# Patient Record
Sex: Male | Born: 1965
Health system: Southern US, Community
[De-identification: ages and names within clinical notes are randomized; demographics above are authoritative.]

## PROBLEM LIST (undated history)

## (undated) DIAGNOSIS — G8929 Other chronic pain: Secondary | ICD-10-CM

## (undated) DIAGNOSIS — K219 Gastro-esophageal reflux disease without esophagitis: Secondary | ICD-10-CM

## (undated) DIAGNOSIS — F419 Anxiety disorder, unspecified: Secondary | ICD-10-CM

## (undated) DIAGNOSIS — M1990 Unspecified osteoarthritis, unspecified site: Secondary | ICD-10-CM

## (undated) HISTORY — PX: ROTATOR CUFF REPAIR: SHX139

## (undated) HISTORY — PX: COLONOSCOPY: SHX174

## (undated) HISTORY — DX: Unspecified osteoarthritis, unspecified site: M19.90

## (undated) HISTORY — DX: Gastro-esophageal reflux disease without esophagitis: K21.9

## (undated) HISTORY — PX: KNEE SURGERY: SHX244

## (undated) HISTORY — DX: Other chronic pain: G89.29

## (undated) HISTORY — PX: OTHER SURGICAL HISTORY: SHX169

## (undated) HISTORY — DX: Anxiety disorder, unspecified: F41.9

---

## 2013-11-01 ENCOUNTER — Other Ambulatory Visit (HOSPITAL_COMMUNITY): Payer: Self-pay | Admitting: Neurosurgery

## 2013-11-01 DIAGNOSIS — M5416 Radiculopathy, lumbar region: Secondary | ICD-10-CM

## 2013-11-03 ENCOUNTER — Ambulatory Visit (HOSPITAL_COMMUNITY)
Admission: RE | Admit: 2013-11-03 | Discharge: 2013-11-03 | Disposition: A | Discharge status: Home / Self Care | Payer: BC Managed Care – PPO | Source: Ambulatory Visit | Attending: Neurosurgery | Admitting: Neurosurgery

## 2013-11-03 ENCOUNTER — Ambulatory Visit (HOSPITAL_COMMUNITY): Admission: RE | Admit: 2013-11-03 | Payer: BC Managed Care – PPO | Source: Ambulatory Visit

## 2013-11-03 ENCOUNTER — Other Ambulatory Visit (HOSPITAL_COMMUNITY): Payer: Self-pay | Admitting: Neurosurgery

## 2013-11-03 DIAGNOSIS — M545 Low back pain, unspecified: Secondary | ICD-10-CM | POA: Insufficient documentation

## 2013-11-03 DIAGNOSIS — M5416 Radiculopathy, lumbar region: Secondary | ICD-10-CM

## 2013-11-03 DIAGNOSIS — M79609 Pain in unspecified limb: Secondary | ICD-10-CM | POA: Insufficient documentation

## 2013-11-03 DIAGNOSIS — M5126 Other intervertebral disc displacement, lumbar region: Secondary | ICD-10-CM | POA: Insufficient documentation

## 2013-11-03 MED ORDER — IOHEXOL 180 MG/ML  SOLN
20.0000 mL | Freq: Once | INTRAMUSCULAR | Status: AC | PRN
  Administered 2013-11-03: 20 mL via INTRATHECAL

## 2013-11-03 MED ORDER — HYDROCODONE-ACETAMINOPHEN 5-325 MG PO TABS: 1.0000 | ORAL_TABLET | ORAL | Status: DC | PRN

## 2013-11-03 MED ORDER — HYDROCODONE-ACETAMINOPHEN 5-325 MG PO TABS
1.0000 | ORAL_TABLET | Freq: Once | ORAL | Status: AC
Start: 2013-11-03 — End: 2013-11-03
  Administered 2013-11-03: 1 via ORAL

## 2013-11-03 MED ORDER — DIAZEPAM 5 MG PO TABS
ORAL_TABLET | ORAL | Status: AC
  Administered 2013-11-03: 5 mg via ORAL
  Filled 2013-11-03: qty 1

## 2013-11-03 MED ORDER — HYDROCODONE-ACETAMINOPHEN 5-325 MG PO TABS
ORAL_TABLET | ORAL | Status: AC
  Filled 2013-11-03: qty 1

## 2013-11-03 MED ORDER — ONDANSETRON HCL 4 MG/2ML IJ SOLN: 4.0000 mg | Freq: Four times a day (QID) | INTRAMUSCULAR | Status: DC | PRN

## 2013-11-03 MED ORDER — DIAZEPAM 5 MG PO TABS
10.0000 mg | ORAL_TABLET | Freq: Once | ORAL | Status: AC
  Administered 2013-11-03: 5 mg via ORAL

## 2013-11-03 NOTE — Discharge Instructions (Signed)
Myelography °Care After °These instructions give you information on caring for yourself after your procedure. Your doctor may also give you specific instructions. Call your doctor if you have any problems or questions after your procedure. °HOME CARE °· Rest often the first day. °· When you rest, lie flat, with your head slightly raised (elevated). °· Avoid heavy lifting and activity for 48 hours. °· You may take the bandage (dressing) off 1 day after the test. °GET HELP RIGHT AWAY IF:  °· You have a very bad headache. °· You have a fever. °MAKE SURE YOU: °· Understand these instructions. °· Will watch your condition. °· Will get help right away if you are not doing well or get worse. °Document Released: 04/22/2008 Document Revised: 06/30/2012 Document Reviewed: 04/07/2012 °ExitCare® Patient Information ©2014 [employer]. ° °

## 2013-11-03 NOTE — Op Note (Signed)
*   No surgery found * Lumbar Myelogram  PATIENT:  Chad Zhang is a 48 y.o. male  PRE-OPERATIVE DIAGNOSIS:  Left lower extremity pain  POST-OPERATIVE DIAGNOSIS:  same  PROCEDURE:  Lumbar Myelogram  SURGEON:  Delainie Chavana  ANESTHESIA:   local LOCAL MEDICATIONS USED:  LIDOCAINE  and Amount: 7 ml Procedure Note: [name] had his back prepared and draped in a sterile manner. I infiltrated 7cc lidocaine lateral to the midline at the L45 level. I entered the thecal sac at the L3/4 innerspace and infiltrated 20cc 180 omnipaque. Fluoroscopy showed the contrast in the thecal sac. [name] tolerated the procedure well. I removed the needle easily.    PATIENT DISPOSITION:  PACU - hemodynamically stable.

## 2013-11-08 NOTE — H&P (Signed)
BP 108/70  Pulse 66  Temp(Src) 97.9 F (36.6 C) (Oral)  Resp 18  Ht 6\' 1"  (1.854 m)  Wt 102.059 kg (225 lb)  BMI 29.69 kg/m2  SpO2 96% HISTORY OF PRESENT ILLNESS: Mr. Chad Zhang is a 48 year old gentleman who presents today for evaluation of pain that he has pain in his back and both lower extremities, right side is frequently worse than the left. Mr. Chad Zhang is a gentleman whom I had seen in the past and states that he has had back problems before he saw me and since he saw me. He certainly did not want to undergo any type of operative treatment and so he has not had an operation. He has not been able to work for the last two years secondary to pain in his lower back. He is right handed. REVIEW OF SYSTEMS: Positive for eyeglasses, back pain, leg pain, and arthritis. He denies constitutional, ear, nose, throat, mouth, cardiovascular, respiratory, gastrointestinal, genitourinary, skin, neurological, psychiatric, endocrine, hematologic, and allergic problems. If he lies flat on his back, that helps him relieve his pain. He says the pain medications and muscle relaxants help to some degree. On his pain chart, he just lists pain across his back and posterior lower extremities. PAST MEDICAL HISTORY:  Current Medical Conditions: Past medical history is otherwise excellent.  Prior Operations: He has undergone shoulder surgery and knee surgery.  Medications and Allergies: Medications are Lyrica and Valium, hydrocodone 7.5 and Flexeril. He has no known drug allergies. FAMILY HISTORY: Mother 59 is in poor health, has Crohn's disease and hypertension. Father is deceased. In his own words, he states he has chronic back pain and pain in legs ongoing for years, but progressively worse since then and is unable to work for two years despite pain medication, prednisone injections, and therapy. He recently had a prednisone dose pack given, which did not help and he was on _____ for a week, which did not help him  either. He has no bowel or bladder dysfunction. He does not describe weakness in his extremities. SOCIAL HISTORY: He does not smoke and does not use alcohol. He does have a history of substance abuse. PHYSICAL EXAMINATION: Vital signs: Height is 73 inches, weight 213 pounds. BMI is 28.1. Blood pressure is 125/82, pulse is 83, and respiratory rate is 19. On exam, he is alert and oriented x4 and answering all questions appropriately. Memory, language, attention span, and fund of knowledge are normal. He has a markedly antalgic gait, limping favoring the right lower extremity, tingling more than the left. He has difficulty taking a 14-inch step. 2+ reflexes in knees and ankles, biceps, triceps, and brachioradialis. Proprioception is intact in the upper and lower extremities. He has normal muscle tone, bulk, and coordination. Pupils are equal, round, and reactive to light. Full extraocular movements. Full visual fields. Hearing intact to finger rub bilaterally. Uvula elevates in the midline. Shoulder shrug is normal. Tongue protrusion is in the midline. He has normal coordination and fine motor movement. DIAGNOSTIC STUDIES: No studies for review. IMPRESSION/PLAN: Mr. Chad Zhang appears to have lumbar radiculopathy. What I will do is have him undergo myelogram and postmyelogram CT. He has metal rods in his eyelid and is unable to get MRIs. I will see him in the office after he has undergone the CT. He was given instruction sheet with regards to the myelogram.

## 2015-08-08 IMAGING — RF DG MYELOGRAM LUMBAR
11 series · 11 of 11 positions shown · non-contrast
Comparison: None

CLINICAL DATA: Low back pain.  Left leg pain.

EXAM:
LUMBAR MYELOGRAM
FLUOROSCOPY TIME:  1 min 40 seconds
PROCEDURE:
Lumbar puncture and intrathecal contrast administration were
performed by Neurosurgeon who will seperately report for the portion
of the procedure. I personally supervised acquisition of the
myelogram images. Needle tip at L4.
TECHNIQUE: Contiguous axial images were obtained through the Lumbar spine after
the intrathecal infusion of infusion. Coronal and sagittal
reconstructions were obtained of the axial image sets.

[Series 1: run · 1 of 1 slices shown (1 of 11)]
[im 1/1]
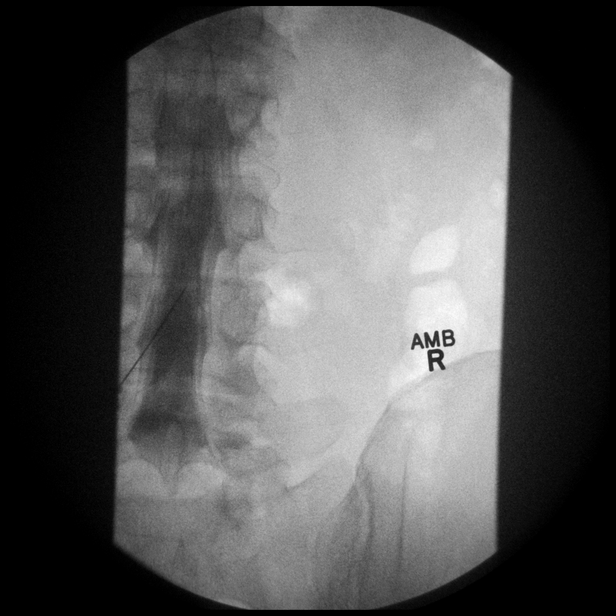

[Series 2: run · 1 of 1 slices shown (2 of 11)]
[im 1/1]
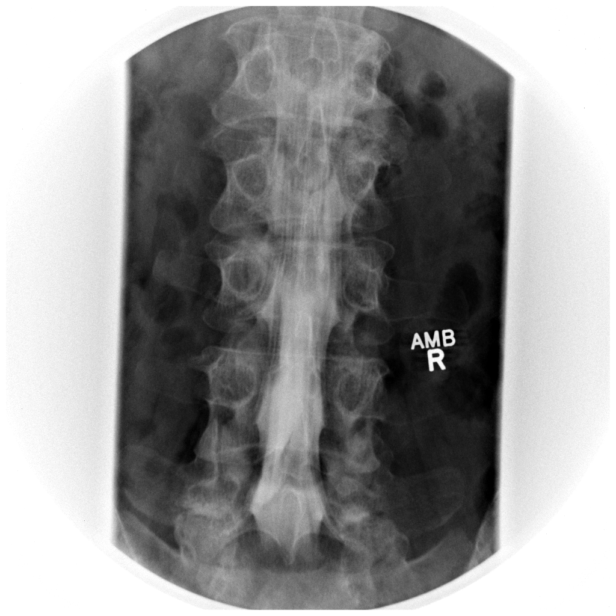

[Series 3: run · 1 of 1 slices shown (3 of 11)]
[im 1/1]
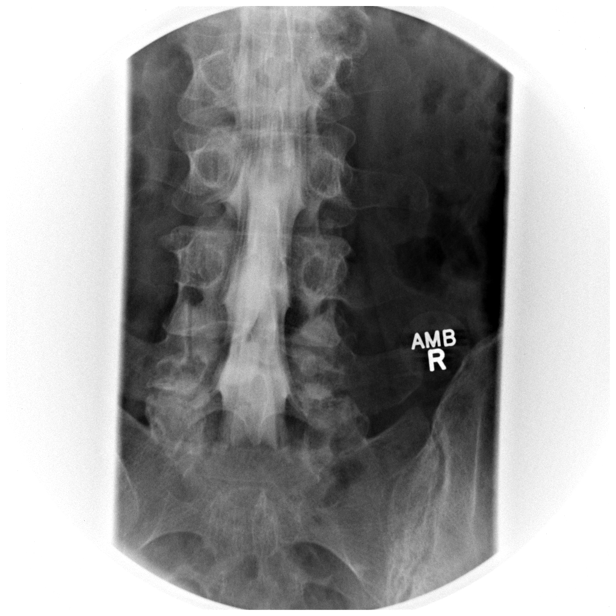

[Series 4: run · 1 of 1 slices shown (4 of 11)]
[im 1/1]
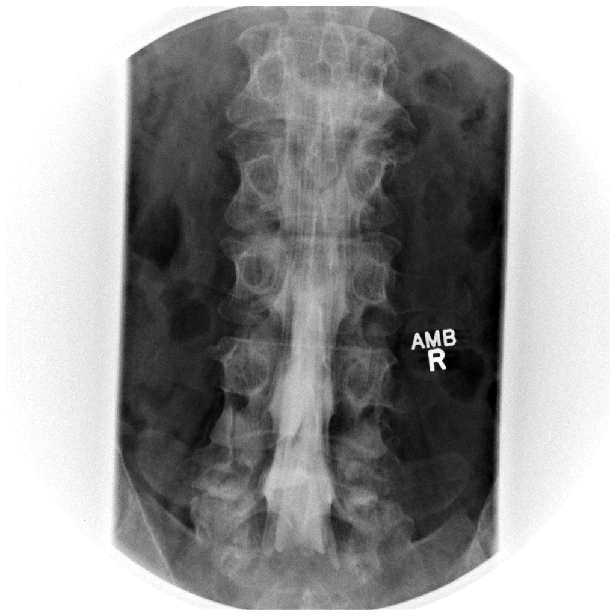

[Series 5: run · 1 of 1 slices shown (5 of 11)]
[im 1/1]
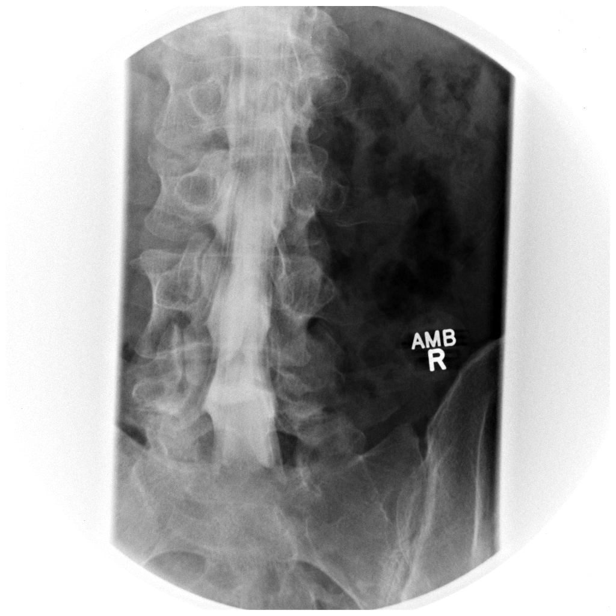

[Series 6: run · 1 of 1 slices shown (6 of 11)]
[im 1/1]
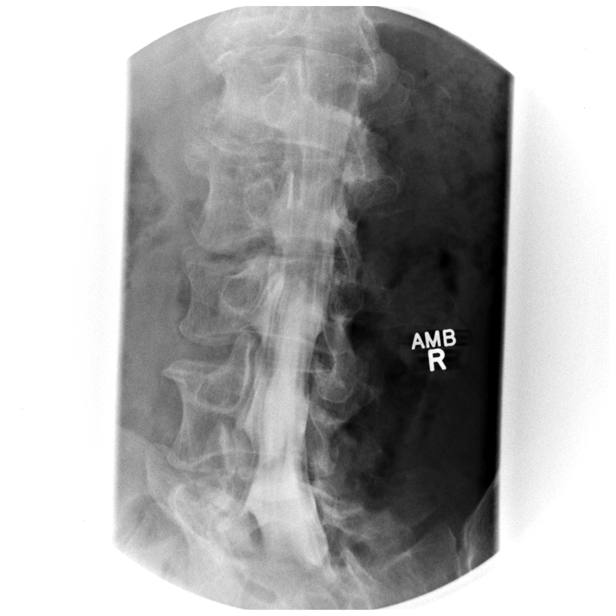

[Series 7: run · 1 of 1 slices shown (7 of 11)]
[im 1/1]
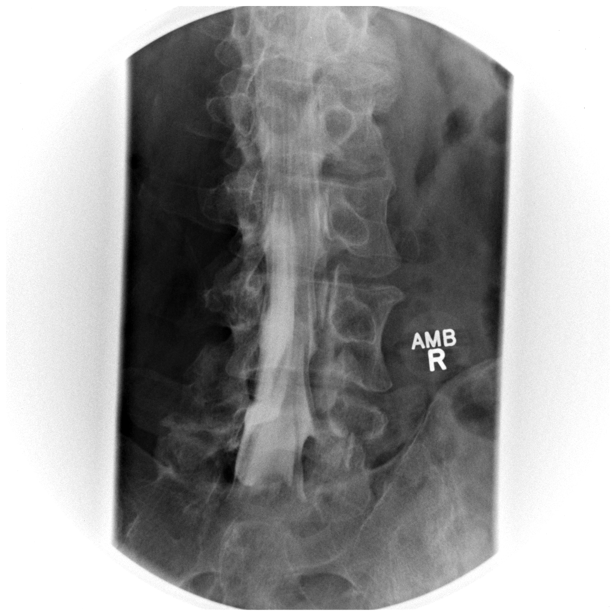

[Series 8: run · 1 of 1 slices shown (8 of 11)]
[im 1/1]
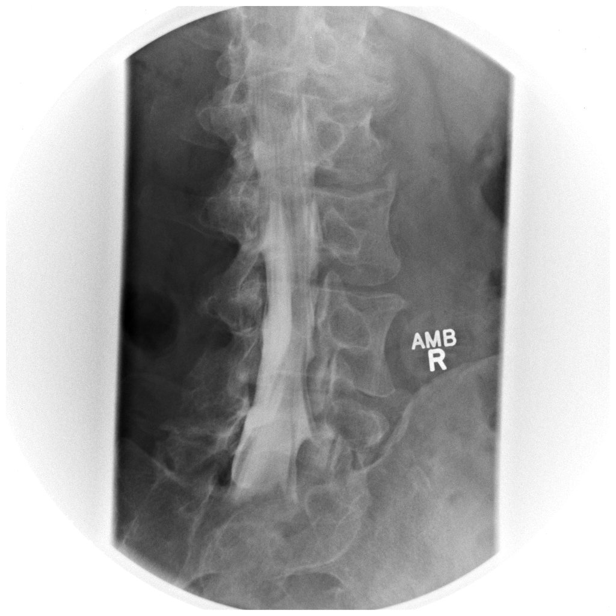

[Series 9: run · 1 of 1 slices shown (9 of 11)]
[im 1/1]
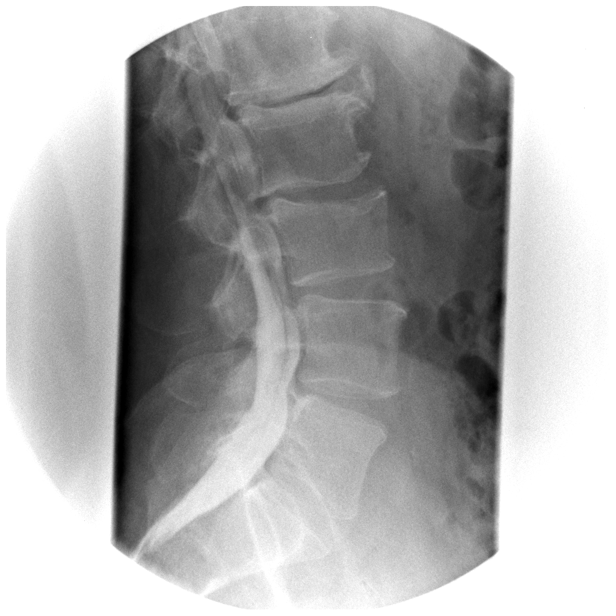

[Series 10: run · 1 of 1 slices shown (10 of 11)]
[im 1/1]
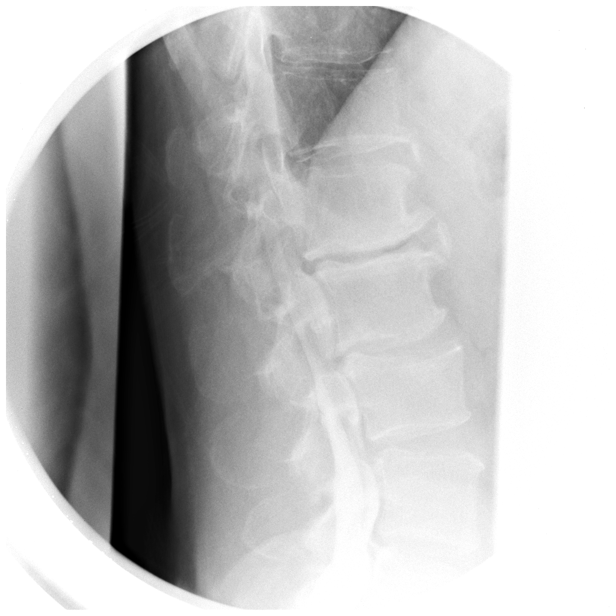

[Series 11: run · 1 of 1 slices shown (11 of 11)]
[im 1/1]
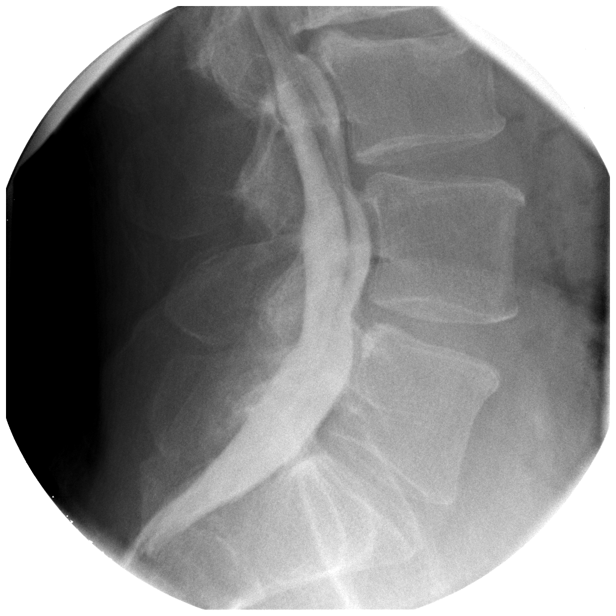

[11 of 11 positions shown; findings below may reference images not displayed]

FINDINGS: LUMBAR MYELOGRAM FINDINGS:

Mixed injection. Mixed subdural and subarachnoid contrast. Ventral
defects at L1-2, L2-3, L3-4, and L4-5. Mild stenosis at the L2-L3
level. Anatomic alignment. No osseous lesions. Minor endplate
compression superiorly at L3. Marked anterior spurring L1-L2.

CT LUMBAR MYELOGRAM FINDINGS:

No prevertebral or paraspinous masses. No osseous destructive
lesion. Subdural injection.

The individual disc spaces were examined with axial images as
follows:

L1-L2: Disc space narrowing. Anterior spurring. Mild annular
bulging. No impingement.

L2-L3: Central and leftward protrusion. Mild to moderate disc space
narrowing with vacuum phenomenon. Mild facet arthropathy. Mild
central canal stenosis and asymmetric left-sided foraminal
narrowing. Left L2 and left L3 nerve root impingement are likely.

L3-L4: Mild bulge. Extraforaminal spurring on the right. Mild facet
arthropathy. Right L3 nerve root impingement not excluded in the
extraforaminal soft tissues.

L4-L5:  Mild bulge.  Mild facet arthropathy.  No impingement.

L5-S1:  Mild left-sided facet arthropathy.  No impingement.
IMPRESSION: LUMBAR MYELOGRAM IMPRESSION:

Mild stenosis at L2-L3.

CT LUMBAR MYELOGRAM IMPRESSION:

Multifactorial stenosis with central and leftward protrusion, along
with bony overgrowth. Left L2 and left L3 nerve root impingement are
likely.

## 2017-06-15 ENCOUNTER — Encounter: Payer: Self-pay | Admitting: Podiatry

## 2017-06-15 ENCOUNTER — Ambulatory Visit: Payer: BLUE CROSS/BLUE SHIELD | Admitting: Podiatry

## 2017-06-15 VITALS — BP 97/64 | HR 74 | Ht 75.0 in | Wt 175.0 lb

## 2017-06-15 DIAGNOSIS — S92214A Nondisplaced fracture of cuboid bone of right foot, initial encounter for closed fracture: Secondary | ICD-10-CM

## 2017-06-15 DIAGNOSIS — S92151A Displaced avulsion fracture (chip fracture) of right talus, initial encounter for closed fracture: Secondary | ICD-10-CM | POA: Diagnosis not present

## 2017-06-15 NOTE — Progress Notes (Signed)
   Subjective:    Patient ID: Chad Zhang, male    DOB: 1965/08/01, 51 y.o.   MRN: [id number] Chief Complaint  Patient presents with  . Foot Injury    fell off ladder 06/06/17 multiple fracture in right foot went to ER currently in a splint and using crutches     HPI 51 y.o. male presents with the above complaint.  Says that he fell off a ladder 06/06/2017.  Was seen at [facility] orthopedics and referred here for consultation.  Reports pain to the right foot.  Was placed in a posterior splint at [facility].  Injury was not Workmen's Comp.  No past medical history on file.   Current Outpatient Medications:  .  celecoxib (CELEBREX) 200 MG capsule, , Disp: , Rfl: 5 .  fentaNYL (DURAGESIC - DOSED MCG/HR) 25 MCG/HR patch, , Disp: , Rfl: 0 .  FLUoxetine (PROZAC) 20 MG capsule, , Disp: , Rfl: 11 .  oxyCODONE (OXY IR/ROXICODONE) 5 MG immediate release tablet, TK 1 T PO Q 4 H PRN P, Disp: , Rfl: 0 .  pantoprazole (PROTONIX) 20 MG tablet, , Disp: , Rfl: 5 .  tiZANidine (ZANAFLEX) 4 MG tablet, , Disp: , Rfl: 1 .  cyclobenzaprine (FLEXERIL) 10 MG tablet, Take 10 mg by mouth 3 (three) times daily as needed for muscle spasms., Disp: , Rfl:  .  diazepam (VALIUM) 5 MG tablet, Take 5 mg by mouth every 6 (six) hours as needed for muscle spasms., Disp: , Rfl:  .  HYDROcodone-acetaminophen (NORCO) 7.5-325 MG per tablet, Take 1 tablet by mouth every 6 (six) hours as needed for moderate pain., Disp: , Rfl:  .  pregabalin (LYRICA) 50 MG capsule, Take 50 mg by mouth 2 (two) times daily., Disp: , Rfl:   Allergies  Allergen Reactions  . Flagyl [Metronidazole] Hives     Review of Systems  Musculoskeletal: Positive for arthralgias and back pain.  All other systems reviewed and are negative.     Objective:   Physical Exam Vitals:   06/15/17 1444  BP: 97/64  Pulse: 74   General AA&O x3. Normal mood and affect.  Vascular Dorsalis pedis and posterior tibial pulses  present 2+ bilaterally    Capillary refill normal to all digits. Pedal hair growth normal.  Neurologic Epicritic sensation grossly present.  Dermatologic No open lesions. Interspaces clear of maceration. Nails well groomed and normal in appearance.  Orthopedic: Edema and contusion noted to the right foot.  Palpation right medial talus, pain to palpation calcaneus, hindfoot and midfoot      Assessment & Plan:  Patient was evaluated and treated all questions answered  Closed Fracture of Talus -CT reviewed.  Rather large talar body loose avulsion fragment with minimal displacement.  Discussed with patient that surgical management of this fracture may ultimately prove unnecessary.  Should pain persist patient ultimately may benefit from excision of the fragment with possible reattachment of the ligament.  -Unna boot applied for swelling. -Should area remain exquisitely tender palpation at next visit will consider possible open management. -We will follow closely  Closed Fractures of Calcaneus (Sustentaculum Tali),  Cuboid, Medial Cuneiform, 5th Metatarsal Base -Will attempt trial of nonoperative management for these fractures. -Unna boot and CAM boot applied for protection and immobilization.  Return in about 1 week (around 06/22/2017).

## 2017-06-21 ENCOUNTER — Encounter: Payer: Self-pay | Admitting: Podiatry

## 2017-06-22 ENCOUNTER — Ambulatory Visit: Payer: BLUE CROSS/BLUE SHIELD | Admitting: Podiatry

## 2017-06-22 DIAGNOSIS — S92151D Displaced avulsion fracture (chip fracture) of right talus, subsequent encounter for fracture with routine healing: Secondary | ICD-10-CM | POA: Diagnosis not present

## 2017-06-29 NOTE — Progress Notes (Signed)
  Subjective:  Patient ID: Chad Zhang, male    DOB: 1965-09-12,  MRN: [id number]  Chief Complaint  Patient presents with  . Foot Injury    whole leg was red took off una boot on Saturday leg was very angry but calmed down by Sunday night    51 y.o. male returns for the above complaint.  States that overall he is doing better.  States the leg was very red when he took his boot off on Saturday but calmed down by the following day.  Pain much improved.  Reports compliance with nonweightbearing  Objective:  There were no vitals filed for this visit. General AA&O x3. Normal mood and affect.  Vascular Pedal pulses palpable.  Neurologic Epicritic sensation grossly intact.  Dermatologic No open lesions. Skin normal texture and turgor.  Orthopedic: No pain to palpation about the medial talus, calcaneus Pain to palpation 5th met base R   Assessment & Plan:  Patient was evaluated and treated and all questions answered.  Closed fracture of talus -No pain on exam today.  Given no pain would consider trial of nonoperative therapy for this injury as well. -Continue nonweightbearing in a cam boot  Closed fractures of calcaneus, cuboid, medial cuneiform, fifth metatarsal base -Continue trial of nonoperative therapy with immobilization in a cam boot  Return in about 2 weeks (around 07/06/2017) for fracture f/u.

## 2017-07-13 ENCOUNTER — Ambulatory Visit (INDEPENDENT_AMBULATORY_CARE_PROVIDER_SITE_OTHER): Payer: BLUE CROSS/BLUE SHIELD

## 2017-07-13 ENCOUNTER — Ambulatory Visit: Payer: BLUE CROSS/BLUE SHIELD | Admitting: Podiatry

## 2017-07-13 DIAGNOSIS — S92151D Displaced avulsion fracture (chip fracture) of right talus, subsequent encounter for fracture with routine healing: Secondary | ICD-10-CM | POA: Diagnosis not present

## 2017-07-13 DIAGNOSIS — S86301A Unspecified injury of muscle(s) and tendon(s) of peroneal muscle group at lower leg level, right leg, initial encounter: Secondary | ICD-10-CM

## 2017-07-13 NOTE — Progress Notes (Signed)
  Subjective:  Patient ID: Chad Zhang, male    DOB: 07-02-1966,  MRN: [id number]  Chief Complaint  Patient presents with  . Foot Injury    follow up right fracture has had a great deal of pain since friday crutch slipped on a tile floor from rain put foot down to catch himself and has had pain ever since    51 y.o. male returns for the above complaint.  States that this past Friday his crutch slipped out from underneath him and has had pain ever since.  Prior to this injury was doing well without pain.  Objective:  There were no vitals filed for this visit. General AA&O x3. Normal mood and affect.  Vascular Pedal pulses palpable.  Neurologic Epicritic sensation grossly intact.  Dermatologic No open lesions. Skin normal texture and turgor.  Orthopedic: Pain to palpation along the peroneal tendons, posterior tibial tendon, fifth metatarsal base on the right foot   Assessment & Plan:  Patient was evaluated and treated and all questions answered.  Closed fracture of talus, calcaneus, cuboid, medial cuneiform, fifth metatarsal base -Continue trial of nonoperative therapy.  No areas of bony tenderness today other than fifth metatarsal base concerning for fracture.  Fifth metatarsal base at the site of known fracture.  New injury likely tendinous in nature we will continue to monitor. -Soft cast applied.  Return in about 10 days (around 07/23/2017) for fracture f/u.

## 2017-07-23 ENCOUNTER — Ambulatory Visit: Payer: BLUE CROSS/BLUE SHIELD | Admitting: Podiatry

## 2017-07-23 ENCOUNTER — Encounter: Payer: Self-pay | Admitting: Podiatry

## 2017-07-23 DIAGNOSIS — S92151D Displaced avulsion fracture (chip fracture) of right talus, subsequent encounter for fracture with routine healing: Secondary | ICD-10-CM

## 2017-07-23 DIAGNOSIS — S92151A Displaced avulsion fracture (chip fracture) of right talus, initial encounter for closed fracture: Secondary | ICD-10-CM

## 2017-07-23 DIAGNOSIS — S92214A Nondisplaced fracture of cuboid bone of right foot, initial encounter for closed fracture: Secondary | ICD-10-CM

## 2017-07-23 NOTE — Progress Notes (Signed)
  Subjective:  Patient ID: Chad Zhang, male    DOB: 04-Jul-1966,  MRN: [id number]  Chief Complaint  Patient presents with  . Foot Problem    my right foot is almost perfect and the pain scale is a 1    51 y.o. male returns for the above complaint.  States that he is having almost no pain.  Reports only some minor pain 1 out of 10.  Endorses some walking on the foot without having any pain  Objective:  There were no vitals filed for this visit. General AA&O x3. Normal mood and affect.  Vascular Pedal pulses palpable.  Neurologic Epicritic sensation grossly intact.  Dermatologic No open lesions. Skin normal texture and turgor.  Orthopedic: No pain to palpation either foot.   Assessment & Plan:  Patient was evaluated and treated and all questions answered.  Closed fracture of talus, calcaneus, cuboid, medial cuneiform, fifth metatarsal base -No pain on exam today. Continue non-operative management. -Patient advised that this no okay transition to weightbearing.  Advised to gradually use his crutches less and less until he can fully weight-bear just in the boot. -Continue boot for another 2 weeks  Follow-up in 2 weeks new x-rays

## 2017-08-10 ENCOUNTER — Ambulatory Visit: Payer: BLUE CROSS/BLUE SHIELD | Admitting: Podiatry

## 2017-08-10 ENCOUNTER — Ambulatory Visit (INDEPENDENT_AMBULATORY_CARE_PROVIDER_SITE_OTHER): Payer: BLUE CROSS/BLUE SHIELD

## 2017-08-10 DIAGNOSIS — S92151D Displaced avulsion fracture (chip fracture) of right talus, subsequent encounter for fracture with routine healing: Secondary | ICD-10-CM

## 2017-08-10 NOTE — Progress Notes (Signed)
  Subjective:  Patient ID: Chad Zhang, male    DOB: 06-12-1966,  MRN: [id number]  No chief complaint on file.  52 y.o. male returns for the above complaint.  Denies pain.  Has been weightbearing in the boot without issue.  Objective:  There were no vitals filed for this visit. General AA&O x3. Normal mood and affect.  Vascular Pedal pulses palpable.  Neurologic Epicritic sensation grossly intact.  Dermatologic No open lesions. Skin normal texture and turgor.  Orthopedic: No pain to palpation either foot.   Assessment & Plan:  Patient was evaluated and treated and all questions answered.  Closed fracture of talus, calcaneus, cuboid, medial cuneiform, fifth metatarsal base -No pain on exam today. -X-rays taken today no evidence of acute fractures.  Consolidation of fractures noted. -Transition out of the boot gradually increasing activity. -Weight-bear as tolerated normal shoe gear.  Return in about 6 weeks (around 09/21/2017) for fracture f/u.

## 2017-09-18 ENCOUNTER — Telehealth: Payer: Self-pay | Admitting: *Deleted

## 2017-09-18 NOTE — Telephone Encounter (Signed)
Male caller states calling for pt.

## 2017-09-18 NOTE — Telephone Encounter (Signed)
Left message to call again Monday.

## 2017-09-21 ENCOUNTER — Ambulatory Visit: Payer: BLUE CROSS/BLUE SHIELD | Admitting: Podiatry

## 2018-05-10 DIAGNOSIS — M5090 Cervical disc disorder, unspecified, unspecified cervical region: Secondary | ICD-10-CM | POA: Diagnosis not present

## 2018-05-10 DIAGNOSIS — M5136 Other intervertebral disc degeneration, lumbar region: Secondary | ICD-10-CM | POA: Diagnosis not present

## 2018-05-10 DIAGNOSIS — M5412 Radiculopathy, cervical region: Secondary | ICD-10-CM | POA: Diagnosis not present

## 2018-05-12 DIAGNOSIS — M5136 Other intervertebral disc degeneration, lumbar region: Secondary | ICD-10-CM | POA: Diagnosis not present

## 2018-05-12 DIAGNOSIS — M5090 Cervical disc disorder, unspecified, unspecified cervical region: Secondary | ICD-10-CM | POA: Diagnosis not present

## 2018-05-12 DIAGNOSIS — Z8781 Personal history of (healed) traumatic fracture: Secondary | ICD-10-CM | POA: Diagnosis not present

## 2018-06-07 DIAGNOSIS — G894 Chronic pain syndrome: Secondary | ICD-10-CM | POA: Diagnosis not present

## 2018-06-07 DIAGNOSIS — M5090 Cervical disc disorder, unspecified, unspecified cervical region: Secondary | ICD-10-CM | POA: Diagnosis not present

## 2018-06-07 DIAGNOSIS — M5412 Radiculopathy, cervical region: Secondary | ICD-10-CM | POA: Diagnosis not present

## 2018-07-07 DIAGNOSIS — M519 Unspecified thoracic, thoracolumbar and lumbosacral intervertebral disc disorder: Secondary | ICD-10-CM | POA: Diagnosis not present

## 2018-07-07 DIAGNOSIS — M5416 Radiculopathy, lumbar region: Secondary | ICD-10-CM | POA: Diagnosis not present

## 2018-07-07 DIAGNOSIS — G894 Chronic pain syndrome: Secondary | ICD-10-CM | POA: Diagnosis not present

## 2018-08-11 DIAGNOSIS — G894 Chronic pain syndrome: Secondary | ICD-10-CM | POA: Diagnosis not present

## 2018-08-11 DIAGNOSIS — M5090 Cervical disc disorder, unspecified, unspecified cervical region: Secondary | ICD-10-CM | POA: Diagnosis not present

## 2018-08-11 DIAGNOSIS — M545 Low back pain: Secondary | ICD-10-CM | POA: Diagnosis not present

## 2018-08-11 DIAGNOSIS — M5412 Radiculopathy, cervical region: Secondary | ICD-10-CM | POA: Diagnosis not present

## 2018-08-11 DIAGNOSIS — Z79899 Other long term (current) drug therapy: Secondary | ICD-10-CM | POA: Diagnosis not present

## 2018-08-11 DIAGNOSIS — Z6822 Body mass index (BMI) 22.0-22.9, adult: Secondary | ICD-10-CM | POA: Diagnosis not present

## 2018-09-09 DIAGNOSIS — M5136 Other intervertebral disc degeneration, lumbar region: Secondary | ICD-10-CM | POA: Diagnosis not present

## 2018-09-09 DIAGNOSIS — M5090 Cervical disc disorder, unspecified, unspecified cervical region: Secondary | ICD-10-CM | POA: Diagnosis not present

## 2018-09-09 DIAGNOSIS — M5412 Radiculopathy, cervical region: Secondary | ICD-10-CM | POA: Diagnosis not present

## 2018-10-08 DIAGNOSIS — M5412 Radiculopathy, cervical region: Secondary | ICD-10-CM | POA: Diagnosis not present

## 2018-10-08 DIAGNOSIS — M519 Unspecified thoracic, thoracolumbar and lumbosacral intervertebral disc disorder: Secondary | ICD-10-CM | POA: Diagnosis not present

## 2018-10-08 DIAGNOSIS — M5090 Cervical disc disorder, unspecified, unspecified cervical region: Secondary | ICD-10-CM | POA: Diagnosis not present

## 2018-11-08 DIAGNOSIS — M5412 Radiculopathy, cervical region: Secondary | ICD-10-CM | POA: Diagnosis not present

## 2018-11-08 DIAGNOSIS — M5090 Cervical disc disorder, unspecified, unspecified cervical region: Secondary | ICD-10-CM | POA: Diagnosis not present

## 2018-11-08 DIAGNOSIS — M5136 Other intervertebral disc degeneration, lumbar region: Secondary | ICD-10-CM | POA: Diagnosis not present

## 2018-12-06 DIAGNOSIS — M5416 Radiculopathy, lumbar region: Secondary | ICD-10-CM | POA: Diagnosis not present

## 2018-12-06 DIAGNOSIS — M5412 Radiculopathy, cervical region: Secondary | ICD-10-CM | POA: Diagnosis not present

## 2018-12-06 DIAGNOSIS — M5136 Other intervertebral disc degeneration, lumbar region: Secondary | ICD-10-CM | POA: Diagnosis not present

## 2022-05-28 ENCOUNTER — Other Ambulatory Visit: Payer: Self-pay

## 2022-05-28 ENCOUNTER — Encounter (HOSPITAL_COMMUNITY): Payer: Self-pay | Admitting: Emergency Medicine

## 2022-05-28 ENCOUNTER — Emergency Department (HOSPITAL_COMMUNITY)
Admission: EM | Admit: 2022-05-28 | Discharge: 2022-05-28 | Disposition: A | Discharge status: Home / Self Care | Payer: Worker's Compensation | Attending: Emergency Medicine | Admitting: Emergency Medicine

## 2022-05-28 DIAGNOSIS — W293XXA Contact with powered garden and outdoor hand tools and machinery, initial encounter: Secondary | ICD-10-CM | POA: Insufficient documentation

## 2022-05-28 DIAGNOSIS — S81012A Laceration without foreign body, left knee, initial encounter: Secondary | ICD-10-CM | POA: Insufficient documentation

## 2022-05-28 DIAGNOSIS — S8992XA Unspecified injury of left lower leg, initial encounter: Secondary | ICD-10-CM | POA: Diagnosis present

## 2022-05-28 DIAGNOSIS — S81812A Laceration without foreign body, left lower leg, initial encounter: Secondary | ICD-10-CM

## 2022-05-28 DIAGNOSIS — Z23 Encounter for immunization: Secondary | ICD-10-CM | POA: Insufficient documentation

## 2022-05-28 MED ORDER — TETANUS-DIPHTH-ACELL PERTUSSIS 5-2.5-18.5 LF-MCG/0.5 IM SUSY
0.5000 mL | PREFILLED_SYRINGE | Freq: Once | INTRAMUSCULAR | Status: AC
  Administered 2022-05-28: 0.5 mL via INTRAMUSCULAR
  Filled 2022-05-28: qty 0.5

## 2022-05-28 MED ORDER — BACITRACIN ZINC 500 UNIT/GM EX OINT
TOPICAL_OINTMENT | CUTANEOUS | Status: AC
  Filled 2022-05-28: qty 0.9

## 2022-05-28 MED ORDER — LIDOCAINE HCL (PF) 1 % IJ SOLN
10.0000 mL | Freq: Once | INTRAMUSCULAR | Status: DC
  Filled 2022-05-28: qty 30

## 2022-05-28 NOTE — ED Triage Notes (Signed)
Pt reports lac on left knee. Pt reports he cut knee w/ chainsaw. Bleeding controlled in triage

## 2022-05-28 NOTE — ED Provider Notes (Signed)
[facility] DEPT Provider Note   CSN: [id number] Arrival date & time: 05/28/22  1019     History  Chief Complaint  Patient presents with   Laceration    Chad Zhang is a 56 y.o. male no significant past medical history presenting to the emergency department for evaluation of a laceration on his left knee.  Patient was using a chainsaw this morning when he accidentally had a cut on his left knee.  Bleeding is minimal.  Unknown tetanus status.  Patient is not on blood thinners.   Laceration      Home Medications Prior to Admission medications   Not on File      Allergies    Patient has no allergy information on record.    Review of Systems   Review of Systems  Skin:        Laceration on left knee.    Physical Exam Updated Vital Signs BP (!) 144/79 (BP Location: Right Arm)   Pulse 69   Temp 98.3 F (36.8 C) (Oral)   Resp 18   SpO2 99%  Physical Exam Vitals and nursing note reviewed.  Constitutional:      Appearance: Normal appearance.  HENT:     Head: Normocephalic.  Eyes:     General: No scleral icterus. Pulmonary:     Effort: Pulmonary effort is normal.  Abdominal:     General: Abdomen is flat.  Musculoskeletal:        General: No deformity.     Comments: 3 cm linear superficial laceration on the left knee with minimal bleeding.  No swelling or redness around the wound.  Neurological:     Mental Status: He is alert.  Psychiatric:        Mood and Affect: Mood normal.     ED Results / Procedures / Treatments   Labs (all labs ordered are listed, but only abnormal results are displayed) Labs Reviewed - No data to display  EKG None  Radiology No results found.  Procedures .Marland KitchenLaceration Repair  Date/Time: 05/28/2022 1:27 PM  Performed by: Rex Kras, PA Authorized by: Rex Kras, PA   Consent:    Consent obtained:  Verbal   Consent given by:  Patient   Risks, benefits, and alternatives were discussed: yes     Risks  discussed:  Infection and pain Universal protocol:    Procedure explained and questions answered to patient or proxy's satisfaction: yes     Relevant documents present and verified: yes     Patient identity confirmed:  Verbally with patient and arm band Anesthesia:    Anesthesia method:  None Laceration details:    Location: L knee.   Length (cm):  3   Depth (mm):  2 Pre-procedure details:    Preparation:  Patient was prepped and draped in usual sterile fashion Exploration:    Limited defect created (wound extended): no     Hemostasis achieved with:  Direct pressure   Imaging outcome: foreign body not noted     Wound exploration: wound explored through full range of motion     Contaminated: no   Treatment:    Area cleansed with:  Saline   Amount of cleaning:  Extensive   Irrigation solution:  Sterile water   Irrigation volume:  200   Irrigation method:  Pressure wash   Visualized foreign bodies/material removed: no     Debridement:  None Skin repair:    Repair method:  Sutures   Suture size:  3-0   Suture material:  Nylon   Suture technique:  Simple interrupted   Number of sutures:  4 Approximation:    Approximation:  Close Repair type:    Repair type:  Intermediate Post-procedure details:    Dressing:  Antibiotic ointment and sterile dressing   Procedure completion:  Tolerated     Medications Ordered in ED Medications - No data to display  ED Course/ Medical Decision Making/ A&P                           Medical Decision Making Risk Prescription drug management.   This patient presents to the ED for concern of laceration on left knee, this involves an extensive number of treatment options, and is a complaint that carries with a high risk of complications and morbidity.  The differential diagnosis includes laceration, cellulitis, fracture, dislocation.  This is not an exhaustive list.  Comorbidities that complicate the patient evaluation See HPI  Social  determinants of health NA  Additional history obtained: Additional history obtained from EMR. External records from outside source obtained and review including prior labs  Cardiac monitoring/EKG: The patient was maintained on a cardiac monitor.  I personally reviewed and interpreted the cardiac monitor which showed an underlying rhythm of: Sinus rhythm.  Lab tests: NA  Imaging studies: NA  Problem list/ ED course/ Critical interventions/ Medical management: HPI: See above Vital signs with BP 144/79.  Otherwise within normal range and stable throughout visit. Laboratory/imaging studies significant for: See above. On physical examination, patient is afebrile and appears in no acute distress.  There is a 3 cm linear laceration on the left knee with minimal bleeding.  Left knee range of motion normal.  See laceration repair procedure note above.  Patient's clinical presentations and laboratory/imaging studies are most concerned for superficial laceration.  Low suspicions for cellulitis.  Tetanus shot given as patient Tdap status is unknown.  Advised patient to follow-up with PCP or urgent care after 7 days for suture removal.  I did not prescribe oral antibiotics as the wound is not contaminated and it is superficial.  Topical antibiotics ordered. Reevaluation of the patient after these medications showed that the patient stayed the same.   I have reviewed the patient home medication medicines and have him make adjustments as needed.  Consultations obtained: NA  Disposition Continued outpatient therapy. Follow-up with PCP recommended for reevaluation of symptoms. Treatment plan discussed with patient.  Pt acknowledged understanding was agreeable to the plan. Worrisome signs and symptoms were discussed with patient, and patient acknowledged understanding to return to the ED if they noticed these signs and symptoms. Patient was stable upon discharge.   This chart was dictated using voice  recognition software.  Despite best efforts to proofread,  errors can occur which can change the documentation meaning.          Final Clinical Impression(s) / ED Diagnoses Final diagnoses:  Laceration of left lower extremity, initial encounter    Rx / DC Orders ED Discharge Orders     None         Rex Kras, [place] 05/28/22 2009    Tegeler, Gwenyth Allegra, MD 05/29/22 [phone]

## 2022-05-28 NOTE — Discharge Instructions (Addendum)
Please return to your PCP or urgent care for suture removal after 7 days.  Please take tylenol/ibuprofen for pain. I recommend close follow-up with PCP for reevaluation.  Please do not hesitate to return to emergency department if worrisome signs symptoms we discussed become apparent.

## 2022-09-04 ENCOUNTER — Ambulatory Visit (AMBULATORY_SURGERY_CENTER): Payer: BLUE CROSS/BLUE SHIELD | Admitting: *Deleted

## 2022-09-04 VITALS — Ht 75.0 in | Wt 185.0 lb

## 2022-09-04 DIAGNOSIS — Z1211 Encounter for screening for malignant neoplasm of colon: Secondary | ICD-10-CM

## 2022-09-04 MED ORDER — NA SULFATE-K SULFATE-MG SULF 17.5-3.13-1.6 GM/177ML PO SOLN: 1.0000 | Freq: Once | ORAL | 0 refills | Status: AC

## 2022-09-04 NOTE — Progress Notes (Signed)
Pt's previsit is done over the phone and all paperwork (prep instructions) sent to patient.  Pt's name and DOB verified at the beginning of the previsit.  Pt denies any difficulty with ambulating.   No egg or soy allergy known to patient  No issues known to pt with past sedation with any surgeries or procedures Patient denies ever being told they had issues or difficulty with intubation  No FH of Malignant Hyperthermia Pt is not on diet pills Pt is not on  home 02  Pt is not on blood thinners  Pt denies issues with constipation  Pt is not on dialysis Pt denies any upcoming cardiac testing Pt encouraged to use to use Singlecare or Goodrx to reduce cost  Patient's chart reviewed by Osvaldo Angst CNRA prior to previsit and patient appropriate for the [facility].  Previsit completed and red dot placed by patient's name on their procedure day (on provider's schedule).

## 2022-09-18 ENCOUNTER — Encounter: Payer: Self-pay | Admitting: Gastroenterology

## 2022-09-19 ENCOUNTER — Ambulatory Visit: Payer: Commercial Managed Care - PPO | Admitting: Gastroenterology

## 2022-09-19 ENCOUNTER — Encounter: Payer: Self-pay | Admitting: Gastroenterology

## 2022-09-19 VITALS — BP 98/68 | HR 63 | Temp 97.8°F | Resp 14 | Ht 75.0 in | Wt 185.0 lb

## 2022-09-19 DIAGNOSIS — Z1211 Encounter for screening for malignant neoplasm of colon: Secondary | ICD-10-CM | POA: Diagnosis present

## 2022-09-19 DIAGNOSIS — D124 Benign neoplasm of descending colon: Secondary | ICD-10-CM | POA: Diagnosis not present

## 2022-09-19 MED ORDER — SODIUM CHLORIDE 0.9 % IV SOLN: 500.0000 mL | Freq: Once | INTRAVENOUS | Status: DC

## 2022-09-19 NOTE — Progress Notes (Signed)
[facility] History and Physical   Primary Care Physician:  Mateo Flow, MD   Reason for Procedure:   CRC Screening  Plan:    colon     HPI: Chad Zhang is a 57 y.o. male    Past Medical History:  Diagnosis Date   Anxiety    Arthritis    Chronic back pain    GERD (gastroesophageal reflux disease)     Past Surgical History:  Procedure Laterality Date   COLONOSCOPY     KNEE SURGERY Left    left index finger Left    ROTATOR CUFF REPAIR Right     Prior to Admission medications   Medication Sig Start Date End Date Taking? Authorizing Provider  ARIPiprazole (ABILIFY) 5 MG tablet Take 5 mg by mouth daily. 09/01/22  Yes [provider]  celecoxib (CELEBREX) 200 MG capsule daily. 06/05/17  Yes [provider]  cyclobenzaprine (FLEXERIL) 10 MG tablet Take 10 mg by mouth 3 (three) times daily as needed for muscle spasms.   Yes [provider]  diazepam (VALIUM) 5 MG tablet Take 5 mg by mouth every 6 (six) hours as needed for muscle spasms.   Yes [provider]  FLUoxetine (PROZAC) 20 MG capsule 60 mg daily. 06/05/17  Yes [provider]  HYDROcodone-acetaminophen (NORCO) 7.5-325 MG per tablet Take 1 tablet by mouth every 6 (six) hours as needed for moderate pain.   Yes [provider]  oxyCODONE (OXY IR/ROXICODONE) 5 MG immediate release tablet Daily PRN 06/06/17  Yes [provider]  pantoprazole (PROTONIX) 20 MG tablet 40 mg daily. 06/05/17  Yes [provider]  pregabalin (LYRICA) 50 MG capsule Take 50 mg by mouth 2 (two) times daily.   Yes [provider]  sildenafil (VIAGRA) 100 MG tablet Take 100 mg by mouth as needed. 08/26/22  Yes [provider]  tiZANidine (ZANAFLEX) 4 MG tablet PRN 06/05/17  Yes [provider]  fentaNYL (DURAGESIC - DOSED MCG/HR) 25 MCG/HR patch  06/05/17   [provider]    Current Outpatient Medications  Medication Sig Dispense Refill    ARIPiprazole (ABILIFY) 5 MG tablet Take 5 mg by mouth daily.     celecoxib (CELEBREX) 200 MG capsule daily.  5   cyclobenzaprine (FLEXERIL) 10 MG tablet Take 10 mg by mouth 3 (three) times daily as needed for muscle spasms.     diazepam (VALIUM) 5 MG tablet Take 5 mg by mouth every 6 (six) hours as needed for muscle spasms.     FLUoxetine (PROZAC) 20 MG capsule 60 mg daily.  11   HYDROcodone-acetaminophen (NORCO) 7.5-325 MG per tablet Take 1 tablet by mouth every 6 (six) hours as needed for moderate pain.     oxyCODONE (OXY IR/ROXICODONE) 5 MG immediate release tablet Daily PRN  0   pantoprazole (PROTONIX) 20 MG tablet 40 mg daily.  5   pregabalin (LYRICA) 50 MG capsule Take 50 mg by mouth 2 (two) times daily.     sildenafil (VIAGRA) 100 MG tablet Take 100 mg by mouth as needed.     tiZANidine (ZANAFLEX) 4 MG tablet PRN  1   fentaNYL (DURAGESIC - DOSED MCG/HR) 25 MCG/HR patch   0   Current Facility-Administered Medications  Medication Dose Route Frequency Provider Last Rate Last Admin   0.9 %  sodium chloride infusion  500 mL Intravenous Once Jackquline Denmark, MD        Allergies as of 09/19/2022 - Review Complete 09/19/2022  Allergen Reaction Noted   Flagyl [metronidazole] Hives 06/15/2017    Family History  Problem Relation Age of Onset   Crohn's disease Mother    Crohn's disease Maternal Aunt    Colon cancer Neg Hx    Esophageal cancer Neg Hx    Stomach cancer Neg Hx    Rectal cancer Neg Hx     Social History   Socioeconomic History   Marital status: Married    Spouse name: Not on file   Number of children: Not on file   Years of education: Not on file   Highest education level: Not on file  Occupational History   Not on file  Tobacco Use   Smoking status: Former    Types: Cigarettes    Quit date: 07/28/2009    Years since quitting: 13.1   Smokeless tobacco: Never  Vaping Use   Vaping Use: Never used  Substance and Sexual Activity   Alcohol use: Not Currently    Drug use: Not Currently    Comment: 30 years ago   Sexual activity: Not on file  Other Topics Concern   Not on file  Social History Narrative   ** Merged History Encounter **       Social Determinants of Health   Financial Resource Strain: Not on file  Food Insecurity: Not on file  Transportation Needs: Not on file  Physical Activity: Not on file  Stress: Not on file  Social Connections: Not on file  Intimate Partner Violence: Not on file    Review of Systems: Positive for none All other review of systems negative except as mentioned in the HPI.  Physical Exam: Vital signs in last 24 hours: '@VSRANGES'$ @   General:   Alert,  Well-developed, well-nourished, pleasant and cooperative in NAD Lungs:  Clear throughout to auscultation.   Heart:  Regular rate and rhythm; no murmurs, clicks, rubs,  or gallops. Abdomen:  Soft, nontender and nondistended. Normal bowel sounds.   Neuro/Psych:  Alert and cooperative. Normal mood and affect. A and O x 3    No significant changes were identified.  The patient continues to be an appropriate candidate for the planned procedure and anesthesia.   Carmell Austria, MD. [facility] 09/19/2022 2:14 PM@

## 2022-09-19 NOTE — Patient Instructions (Signed)
Impression/Recommendations:  Polyp and hemorrhoid handouts given to patient.  Resume previous diet. Continue present medications. Await pathology results.  Repeat colonoscopy for surveillance based on pathology results.  Return to GI clinic.  YOU HAD AN ENDOSCOPIC PROCEDURE TODAY AT [facility]:   Refer to the procedure report that was given to you for any specific questions about what was found during the examination.  If the procedure report does not answer your questions, please call your gastroenterologist to clarify.  If you requested that your care partner not be given the details of your procedure findings, then the procedure report has been included in a sealed envelope for you to review at your convenience later.  YOU SHOULD EXPECT: Some feelings of bloating in the abdomen. Passage of more gas than usual.  Walking can help get rid of the air that was put into your GI tract during the procedure and reduce the bloating. If you had a lower endoscopy (such as a colonoscopy or flexible sigmoidoscopy) you may notice spotting of blood in your stool or on the toilet paper. If you underwent a bowel prep for your procedure, you may not have a normal bowel movement for a few days.  Please Note:  You might notice some irritation and congestion in your nose or some drainage.  This is from the oxygen used during your procedure.  There is no need for concern and it should clear up in a day or so.  SYMPTOMS TO REPORT IMMEDIATELY:  Following lower endoscopy (colonoscopy or flexible sigmoidoscopy):  Excessive amounts of blood in the stool  Significant tenderness or worsening of abdominal pains  Swelling of the abdomen that is new, acute  Fever of 100F or higher For urgent or emergent issues, a gastroenterologist can be reached at any hour by calling [phone]. Do not use MyChart messaging for urgent concerns.    DIET:  We do recommend a small meal at first, but then you may  proceed to your regular diet.  Drink plenty of fluids but you should avoid alcoholic beverages for 24 hours.  ACTIVITY:  You should plan to take it easy for the rest of today and you should NOT DRIVE or use heavy machinery until tomorrow (because of the sedation medicines used during the test).    FOLLOW UP: Our staff will call the number listed on your records the next business day following your procedure.  We will call around 7:15- 8:00 am to check on you and address any questions or concerns that you may have regarding the information given to you following your procedure. If we do not reach you, we will leave a message.     If any biopsies were taken you will be contacted by phone or by letter within the next 1-3 weeks.  Please call us at [phone] if you have not heard about the biopsies in 3 weeks.    SIGNATURES/CONFIDENTIALITY: You and/or your care partner have signed paperwork which will be entered into your electronic medical record.  These signatures attest to the fact that that the information above on your After Visit Summary has been reviewed and is understood.  Full responsibility of the confidentiality of this discharge information lies with you and/or your care-partner.

## 2022-09-19 NOTE — Progress Notes (Signed)
Called to room to assist during endoscopic procedure.  Patient ID and intended procedure confirmed with present staff. Received instructions for my participation in the procedure from the performing physician.  

## 2022-09-19 NOTE — Op Note (Signed)
[facility] Patient Name: Hosia Cardella Procedure Date: 09/19/2022 2:12 PM MRN: [id number] Endoscopist: Jackquline Denmark , MD, [id number] Age: 57 Referring MD:  Date of Birth: April 16, 1966 Gender: Male Account #: [id number] Procedure:                Colonoscopy Indications:              Screening for colorectal malignant neoplasm Medicines:                Monitored Anesthesia Care Procedure:                Pre-Anesthesia Assessment:                           - Prior to the procedure, a History and Physical                            was performed, and patient medications and                            allergies were reviewed. The patient's tolerance of                            previous anesthesia was also reviewed. The risks                            and benefits of the procedure and the sedation                            options and risks were discussed with the patient.                            All questions were answered, and informed consent                            was obtained. Prior Anticoagulants: The patient has                            taken no anticoagulant or antiplatelet agents. ASA                            Grade Assessment: II - A patient with mild systemic                            disease. After reviewing the risks and benefits,                            the patient was deemed in satisfactory condition to                            undergo the procedure.                           After obtaining informed consent, the colonoscope  was passed under direct vision. Throughout the                            procedure, the patient's blood pressure, pulse, and                            oxygen saturations were monitored continuously. The                            Olympus [id number]-[id number]) Colonoscope was                            introduced through the anus and advanced to the 2                            cm into the ileum. The  colonoscopy was performed                            without difficulty. The patient tolerated the                            procedure well. The quality of the bowel                            preparation was good. The terminal ileum, ileocecal                            valve, appendiceal orifice, and rectum were                            photographed. Scope In: 2:18:57 PM Scope Out: 2:32:11 PM Scope Withdrawal Time: 0 hours 9 minutes 56 seconds  Total Procedure Duration: 0 hours 13 minutes 14 seconds  Findings:                 A 6 mm polyp was found in the mid descending colon.                            The polyp was sessile. The polyp was removed with a                            cold snare. Resection and retrieval were complete.                           Non-bleeding internal hemorrhoids were found during                            retroflexion. The hemorrhoids were small and Grade                            I (internal hemorrhoids that do not prolapse).                           The terminal ileum appeared normal.  The exam was otherwise without abnormality on                            direct and retroflexion views. Complications:            No immediate complications. Estimated Blood Loss:     Estimated blood loss: none. Impression:               - One 6 mm polyp in the mid descending colon,                            removed with a cold snare. Resected and retrieved.                           - Non-bleeding internal hemorrhoids.                           - The examined portion of the ileum was normal.                           - The examination was otherwise normal on direct                            and retroflexion views. Recommendation:           - Patient has a contact number available for                            emergencies. The signs and symptoms of potential                            delayed complications were discussed with the                             patient. Return to normal activities tomorrow.                            Written discharge instructions were provided to the                            patient.                           - Resume previous diet.                           - Continue present medications.                           - Await pathology results.                           - Repeat colonoscopy for surveillance based on                            pathology results.                           -  Return to GI clinic. Jackquline Denmark, MD 09/19/2022 2:38:10 PM This report has been signed electronically.

## 2022-09-19 NOTE — Progress Notes (Signed)
Pt's states no medical or surgical changes since previsit or office visit. 

## 2022-09-19 NOTE — Progress Notes (Signed)
Report to PACU, RN, vss, BBS= Clear.  

## 2022-09-22 ENCOUNTER — Telehealth: Payer: Self-pay | Admitting: *Deleted

## 2022-09-22 NOTE — Telephone Encounter (Signed)
  Follow up Call-     09/19/2022    1:44 PM  Call back number  Post procedure Call Back phone  # [phone]  Permission to leave phone message Yes     Patient questions:  Do you have a fever, pain , or abdominal swelling? No. Pain Score  0 *  Have you tolerated food without any problems? Yes.    Have you been able to return to your normal activities? Yes.    Do you have any questions about your discharge instructions: Diet   No. Medications  No. Follow up visit  No.  Do you have questions or concerns about your Care? No.  Actions: * If pain score is 4 or above: No action needed, pain <4.

## 2022-09-28 ENCOUNTER — Encounter: Payer: Self-pay | Admitting: Gastroenterology
